# Patient Record
Sex: Female | Born: 1985 | Race: White | Hispanic: No | Marital: Married | Smoking: Never smoker
Health system: Southern US, Community
[De-identification: ages and names within clinical notes are randomized; demographics above are authoritative.]

## PROBLEM LIST (undated history)

## (undated) DIAGNOSIS — R519 Headache, unspecified: Secondary | ICD-10-CM

## (undated) DIAGNOSIS — R51 Headache: Secondary | ICD-10-CM

## (undated) HISTORY — PX: WISDOM TOOTH EXTRACTION: SHX21

## (undated) HISTORY — PX: FOOT SURGERY: SHX648

---

## 2017-07-24 ENCOUNTER — Other Ambulatory Visit (HOSPITAL_COMMUNITY): Payer: Self-pay | Admitting: Nurse Practitioner

## 2017-07-24 DIAGNOSIS — O289 Unspecified abnormal findings on antenatal screening of mother: Secondary | ICD-10-CM

## 2017-07-29 ENCOUNTER — Encounter (HOSPITAL_COMMUNITY): Payer: Self-pay | Admitting: *Deleted

## 2017-07-31 ENCOUNTER — Ambulatory Visit (HOSPITAL_COMMUNITY)
Admission: RE | Admit: 2017-07-31 | Discharge: 2017-07-31 | Disposition: A | Discharge status: Home / Self Care | Payer: PRIVATE HEALTH INSURANCE | Source: Ambulatory Visit | Attending: Nurse Practitioner | Admitting: Nurse Practitioner

## 2017-07-31 ENCOUNTER — Encounter (HOSPITAL_COMMUNITY): Payer: Self-pay

## 2017-07-31 ENCOUNTER — Ambulatory Visit (HOSPITAL_COMMUNITY): Admission: RE | Admit: 2017-07-31 | Payer: PRIVATE HEALTH INSURANCE | Source: Ambulatory Visit

## 2017-07-31 ENCOUNTER — Other Ambulatory Visit (HOSPITAL_COMMUNITY): Payer: Self-pay | Admitting: Nurse Practitioner

## 2017-07-31 DIAGNOSIS — O289 Unspecified abnormal findings on antenatal screening of mother: Secondary | ICD-10-CM | POA: Insufficient documentation

## 2017-07-31 DIAGNOSIS — O99212 Obesity complicating pregnancy, second trimester: Secondary | ICD-10-CM | POA: Insufficient documentation

## 2017-07-31 DIAGNOSIS — Z3A14 14 weeks gestation of pregnancy: Secondary | ICD-10-CM

## 2017-07-31 DIAGNOSIS — Z315 Encounter for genetic counseling: Secondary | ICD-10-CM | POA: Insufficient documentation

## 2017-07-31 DIAGNOSIS — Z363 Encounter for antenatal screening for malformations: Secondary | ICD-10-CM

## 2017-07-31 DIAGNOSIS — O283 Abnormal ultrasonic finding on antenatal screening of mother: Secondary | ICD-10-CM | POA: Insufficient documentation

## 2017-07-31 HISTORY — DX: Headache: R51

## 2017-07-31 HISTORY — DX: Headache, unspecified: R51.9

## 2017-07-31 NOTE — Progress Notes (Signed)
Genetic Counseling  High-Risk Gestation Note  Appointment Date:  07/31/2017 Referred By: Orbie Hurst, NP Date of Birth:  March 28, 1985 Partner:  Kenn File   Pregnancy History: [id number] Estimated Date of Delivery: 01/28/18 Estimated Gestational Age: [redacted]w[redacted]d Attending: Particia Nearing, MD  Tina Martinez and her husband, Mr. Kateryna Grantham, were seen for genetic counseling because of previous ultrasound finding of increased nuchal translucency.  The patient's mother was also present for today's visit.   In summary:  Reviewed increased nuchal translucency on ultrasound and associated possible etiologies  Discussed significance of prior screening for fetal aneuploidy  NIPS (Panorama) within normal limits  Offered additional screening  NIPS for single gene conditions ([facility])- declined today  Expanded carrier screening panel- declined today  Detailed ultrasound- scheduled 08/27/17  Fetal echocardiogram- being scheduled  Discussed option of diagnostic testing  Amniocentesis- declined  Reviewed ACOG carrier screening options  Reviewed family history concerns  The patient had a nuchal translucency (NT) ultrasound at the referring provider's office, which revealed an NT measurement of 3.7 mm at CRL of 71.6 mm. Ultrasound was performed today. Complete ultrasound results reported under separate cover.    We discussed that the fetal NT refers to a fluid filled space between the skin and soft tissues behind the cervical spine. This space is traditionally measurable between 11 and 13.[redacted] weeks gestation and is considered enlarged when the measurement is equal to or greater than the 95th percentile for the gestational age. This couple was counseled regarding the various common etiologies for an enlarged NT including: aneuploidy, single gene conditions, cardiac or great vessel abnormalities, lymphatic system failure, decreased fetal movement, and fetal anemia.   We reviewed chromosomes,  nondisjunction, and the common features and prognoses of Down syndrome and other aneuploidies.  Tina Martinez previously had noninvasive prenatal screening (NIPS)/prenatal cell free DNA testing facilitated through her OB provider. This screening, Panorama through [facility] laboratory was within normal limits, indicating a less than 1 in 10,000 risk for Trisomy 21, Trisomy 18, Trisomy 13, and monosomy X. Additional screening for 22q11.2 deletion syndrome was within normal limits. We reviewed the methodology and sensitivity of NIPS. They understand that this is highly sensitive and specific but is not diagnostic and does not assess for all chromosome conditions.   We reviewed other available screening option of detailed ultrasound and the benefits and limitations as a screen for fetal aneuploidy. Specifically, we discussed the conditions for which each test screens, the detection rates, and false positive rates of each. They were also counseled regarding diagnostic testing via amniocentesis for karyotype and microarray analysis. We reviewed the associated risks for complications, including spontaneous pregnancy loss. We discussed the possible results that the tests might provide including: positive, negative, unanticipated, and no result. Finally, they were counseled regarding the cost of each option and potential out of pocket expenses. Tina Martinez declined amniocentesis at this time.   In addition, we discussed that an NT of 3.7 mm is associated with an approximate 3% risk for a fetal cardiac anomaly. We discussed the association with less common structural defects and increased NT also. We discussed the options of a fetal echocardiogram and detailed anatomy ultrasound as methods to evaluate the fetal heart. A fetal echocardiogram was ordered today. Detailed ultrasound is scheduled for 08/27/17.   We also discussed single gene conditions. They were counseled that an increased NT is associated with an increased chance  for specific single gene conditions including Noonan spectrum disorders, skeletal dysplasias, SLOS, CdLS, and many others.  We discussed that these conditions are not routinely tested for prenatally unless ultrasound findings or family history significantly increase the suspicion of a specific single gene disorder; however, there is new noninvasive prenatal screening (NIPS) technology which assesses for specific alterations in 30 genes, including the genes associated with Noonan syndrome, some skeletal dysplasias, and CdLS. We discussed that this testing, marketed as Javier Docker, is available. We reviewed the benefits, limitations, and cost of this technology.  They understand that many of the features of these conditions can be detected by detailed ultrasound during the second trimester; however, ultrasound cannot definitively diagnose or rule out these conditions. We briefly reviewed common inheritance patterns (dominant, recessive, and X-linked) as well as the associated risks of recurrence. Regarding autosomal recessive and some X-linked conditions, we discussed the screening option of expanded carrier screening panels to assess for carrier status of a panel of single gene conditions, some of which but not all may have increased NT as an associated feature. We reviewed risks, benefits, and limitations of expanded carrier screening for the couple. After careful consideration, Tina Martinez declined single gene NIPS ([facility]ara) and expanded carrier screening at today's visit. She stated that she would possibly consider these further genetic screens pending results of detailed ultrasound.   We then discussed that an increased NT value can be a normal variant, which can resolve during the pregnancy. They were counseled that the fetal prognosis depends on the underlying etiology of the enlarged NT and further anticipatory guidance can be provided if a diagnosis is discovered.  Tina Martinez was provided with written  information regarding cystic fibrosis (CF), spinal muscular atrophy (SMA) and hemoglobinopathies including the carrier frequency, availability of carrier screening and prenatal diagnosis if indicated.  In addition, we discussed that CF and hemoglobinopathies are routinely screened for as part of the [place] newborn screening panel.  After further discussion, she declined screening for CF, SMA and hemoglobinopathies.  Both family histories were reviewed and found to be contributory for a female maternal second cousin once removed to the patient with Down syndrome (unspecified type). We discussed that 95% of cases of Down syndrome are not inherited and are the result of non-disjunction.  Three to 4% of cases of Down syndrome are the result of a translocation involving chromosome #21.  We discussed the option of chromosome analysis to determine if an individual is a carrier of a balanced translocation involving chromosome #21.  If an individual carries a balanced translocation involving chromosome #21, then the chance to have a baby with Down syndrome would be greater than the maternal age-related risk. The reported family history is most suggestive of sporadically occurring Down syndrome.      Additionally, the patient reported three female maternal second cousins once removed with autism, underlying etiology unknown. We discussed that autism is part of the spectrum of conditions referred to as Autistic spectrum disorders (ASD). We discussed that ASDs are among the most common neurodevelopmental disorders, with approximately 1 in 68 children meeting criteria for ASD, according to the Centers for Disease Control. Approximately 80% of individuals diagnosed are female. There is strong evidence that genetic factors play a critical role in development of ASD. There have been recent advances in identifying specific genetic causes of ASD, however, there are still many individuals for whom the etiology of the ASD is not known. The  majority of individuals with ASD (70-80%) have essential autism. There is strong evidence that genetic factors play a critical role in development of ASD. Some  individuals with ASDs are found to have causative differences in karyotype analysis, chromosomal microarray analysis, or single genes.  They understand that at this time there is not genetic testing available for ASD for most families. In the case of an identified genetic cause, recurrence risk estimate may change, and in some cases could be up to 50%. The patient is aware that for many individuals with autism spectrum disorders an underlying genetic cause is not identified at this time. In the absence of an identified genetic etiology, prenatal screening or testing would not be available in the current pregnancy for the autism spectrum disorders in the family. We discussed the option of Fragile X carrier screening with the patient including benefits and limitations. She declined Fragile X (FMR1) carrier screening at this time. Without further information regarding the provided family history, an accurate genetic risk cannot be calculated. Further genetic counseling is warranted if more information is obtained.  Mrs. Veneziano denied exposure to environmental toxins or chemical agents. She denied the use of alcohol, tobacco or street drugs. She denied significant viral illnesses during the course of her pregnancy.   I counseled this couple regarding the above risks and available options.  The approximate face-to-face time with the genetic counselor was 45 minutes.      Quinn Plowman, MS Certified Genetic Counselor 07/31/2017

## 2017-08-03 ENCOUNTER — Other Ambulatory Visit (HOSPITAL_COMMUNITY): Payer: Self-pay | Admitting: Nurse Practitioner

## 2017-08-03 ENCOUNTER — Other Ambulatory Visit (HOSPITAL_COMMUNITY): Payer: Self-pay | Admitting: *Deleted

## 2017-08-03 DIAGNOSIS — O289 Unspecified abnormal findings on antenatal screening of mother: Secondary | ICD-10-CM

## 2017-08-03 DIAGNOSIS — O283 Abnormal ultrasonic finding on antenatal screening of mother: Secondary | ICD-10-CM

## 2017-08-03 DIAGNOSIS — Z3A14 14 weeks gestation of pregnancy: Secondary | ICD-10-CM

## 2017-08-27 ENCOUNTER — Other Ambulatory Visit (HOSPITAL_COMMUNITY): Payer: Self-pay | Admitting: *Deleted

## 2017-08-27 ENCOUNTER — Ambulatory Visit (HOSPITAL_COMMUNITY)
Admission: RE | Admit: 2017-08-27 | Discharge: 2017-08-27 | Disposition: A | Discharge status: Home / Self Care | Payer: Self-pay | Source: Ambulatory Visit | Attending: Nurse Practitioner | Admitting: Nurse Practitioner

## 2017-08-27 ENCOUNTER — Encounter (HOSPITAL_COMMUNITY): Payer: Self-pay

## 2017-08-27 ENCOUNTER — Other Ambulatory Visit (HOSPITAL_COMMUNITY): Payer: Self-pay | Admitting: Maternal and Fetal Medicine

## 2017-08-27 DIAGNOSIS — E669 Obesity, unspecified: Secondary | ICD-10-CM | POA: Insufficient documentation

## 2017-08-27 DIAGNOSIS — O99212 Obesity complicating pregnancy, second trimester: Secondary | ICD-10-CM | POA: Insufficient documentation

## 2017-08-27 DIAGNOSIS — O359XX0 Maternal care for (suspected) fetal abnormality and damage, unspecified, not applicable or unspecified: Secondary | ICD-10-CM

## 2017-08-27 DIAGNOSIS — O289 Unspecified abnormal findings on antenatal screening of mother: Secondary | ICD-10-CM

## 2017-08-27 DIAGNOSIS — O283 Abnormal ultrasonic finding on antenatal screening of mother: Secondary | ICD-10-CM | POA: Insufficient documentation

## 2017-08-27 DIAGNOSIS — Z3A18 18 weeks gestation of pregnancy: Secondary | ICD-10-CM

## 2017-08-27 DIAGNOSIS — Z363 Encounter for antenatal screening for malformations: Secondary | ICD-10-CM

## 2017-08-27 DIAGNOSIS — Z362 Encounter for other antenatal screening follow-up: Secondary | ICD-10-CM

## 2017-09-10 ENCOUNTER — Other Ambulatory Visit: Payer: Self-pay

## 2017-09-10 ENCOUNTER — Encounter (HOSPITAL_COMMUNITY): Payer: Self-pay

## 2017-09-25 ENCOUNTER — Other Ambulatory Visit (HOSPITAL_COMMUNITY): Payer: Self-pay | Admitting: Maternal and Fetal Medicine

## 2017-09-25 ENCOUNTER — Encounter (HOSPITAL_COMMUNITY): Payer: Self-pay

## 2017-09-25 ENCOUNTER — Ambulatory Visit (HOSPITAL_COMMUNITY)
Admission: RE | Admit: 2017-09-25 | Discharge: 2017-09-25 | Disposition: A | Discharge status: Home / Self Care | Payer: PRIVATE HEALTH INSURANCE | Source: Ambulatory Visit | Attending: Nurse Practitioner | Admitting: Nurse Practitioner

## 2017-09-25 DIAGNOSIS — Z362 Encounter for other antenatal screening follow-up: Secondary | ICD-10-CM | POA: Insufficient documentation

## 2017-09-25 DIAGNOSIS — O359XX0 Maternal care for (suspected) fetal abnormality and damage, unspecified, not applicable or unspecified: Secondary | ICD-10-CM

## 2017-09-25 DIAGNOSIS — Z3A22 22 weeks gestation of pregnancy: Secondary | ICD-10-CM

## 2017-09-25 DIAGNOSIS — O289 Unspecified abnormal findings on antenatal screening of mother: Secondary | ICD-10-CM | POA: Insufficient documentation

## 2017-09-25 DIAGNOSIS — O99212 Obesity complicating pregnancy, second trimester: Secondary | ICD-10-CM | POA: Insufficient documentation

## 2017-09-25 DIAGNOSIS — O283 Abnormal ultrasonic finding on antenatal screening of mother: Secondary | ICD-10-CM

## 2018-04-22 ENCOUNTER — Encounter (HOSPITAL_COMMUNITY): Payer: Self-pay

## 2019-01-08 IMAGING — US US MFM OB FOLLOW-UP
1 series · 14 of 28 positions shown · non-contrast
Comparison: none

[Series 1: us mfm ob follow-up · 57 acquisitions, 14 frames shown]
[im 3/57]
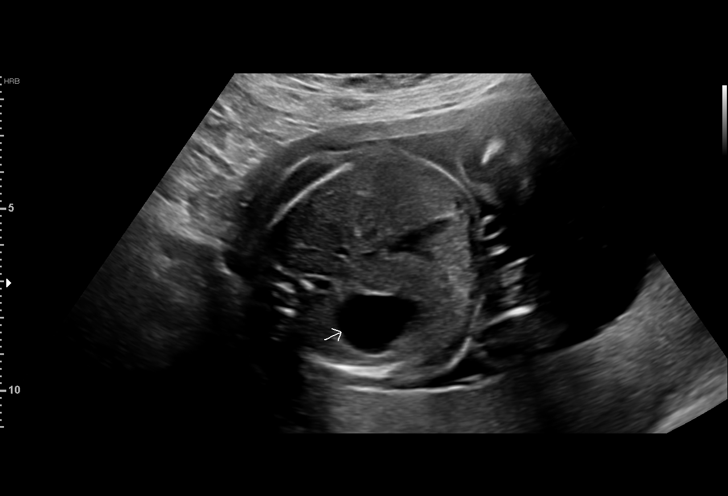
[im 7/57]
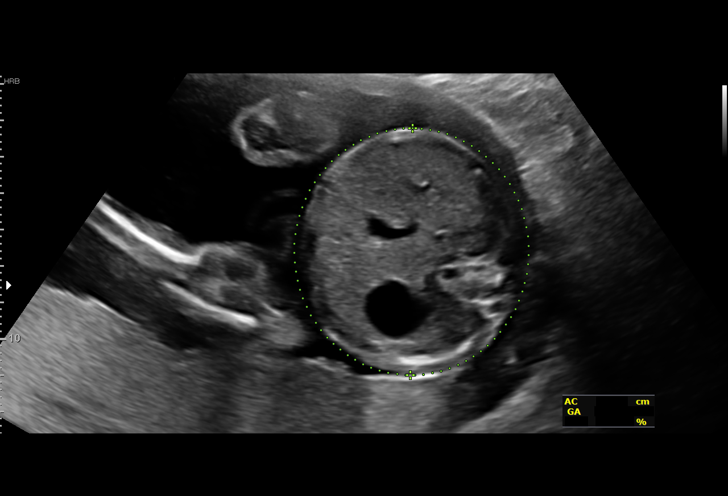
[im 11/57]
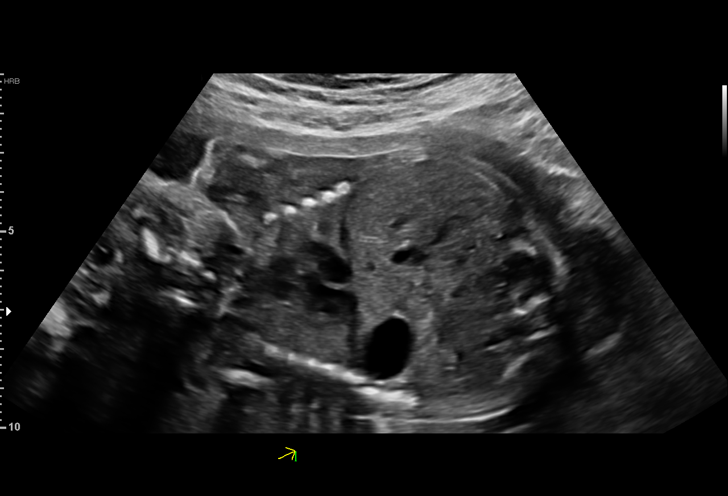
[im 15/57]
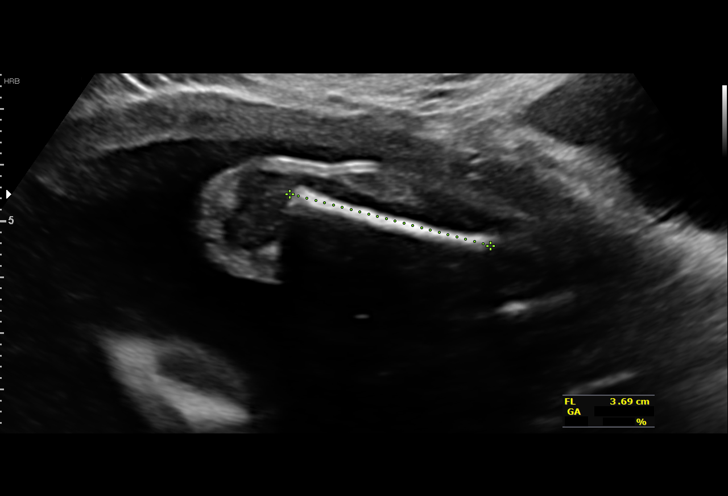
[im 19/57]
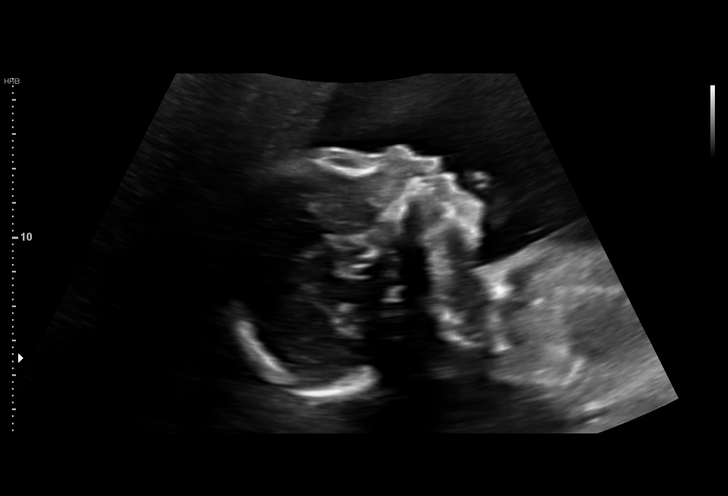
[im 23/57]
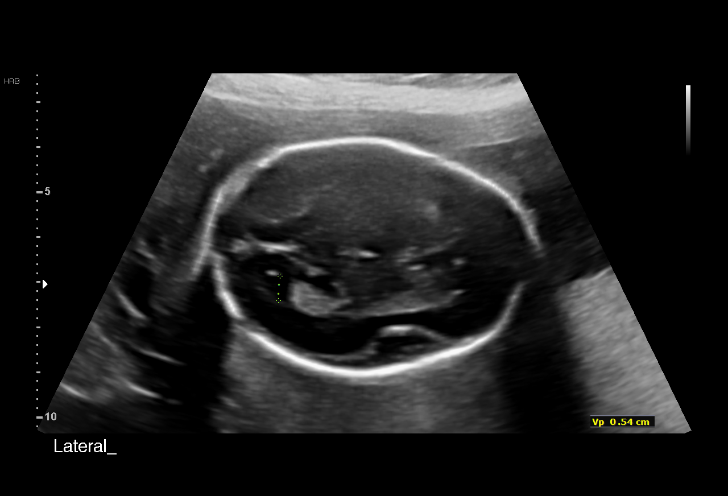
[im 27/57]
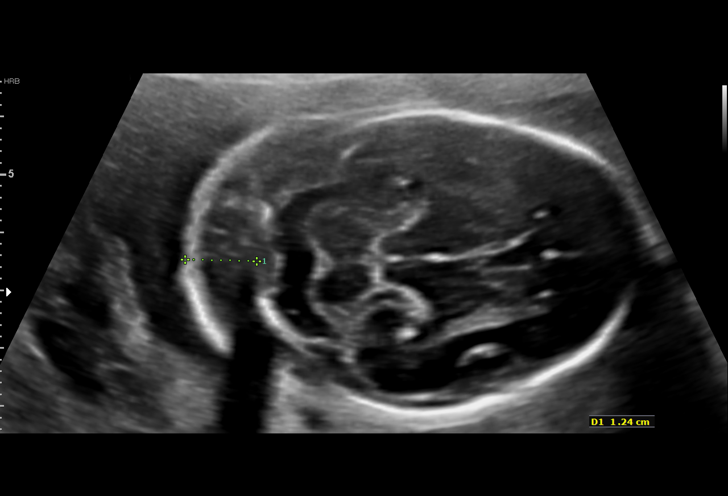
[im 32/57]
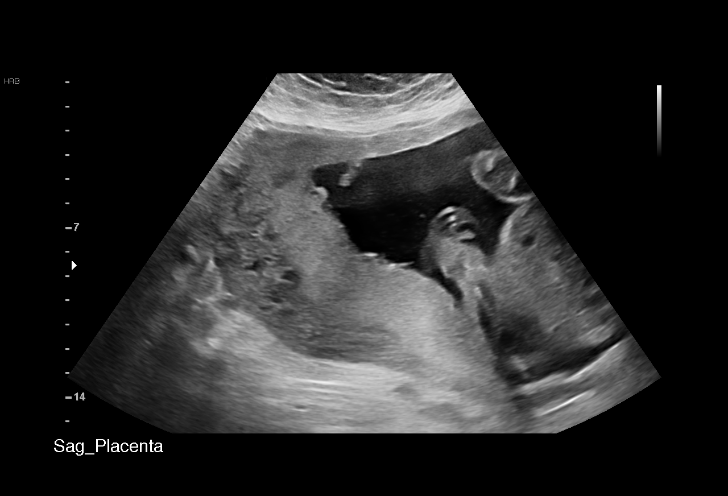
[im 36/57]
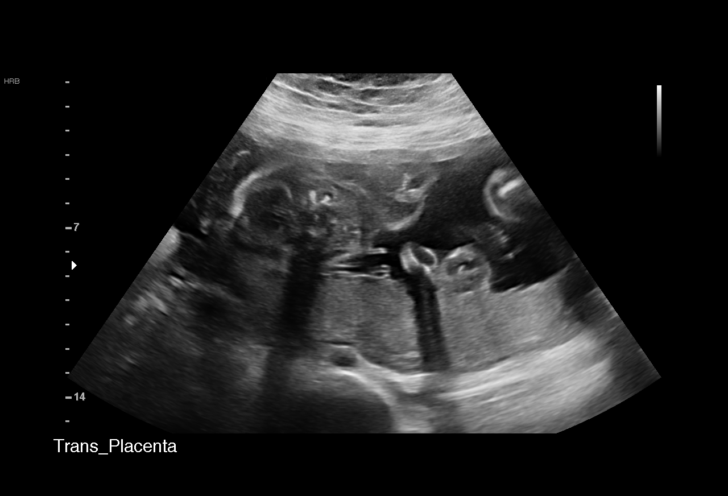
[im 40/57]
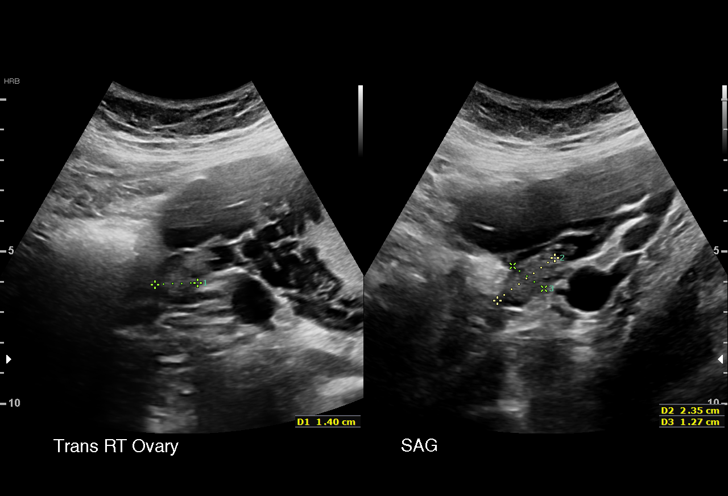
[im 44/57]
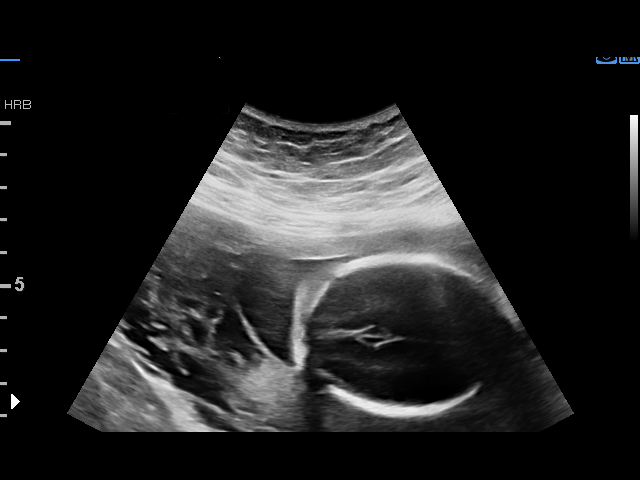
[im 48/57]
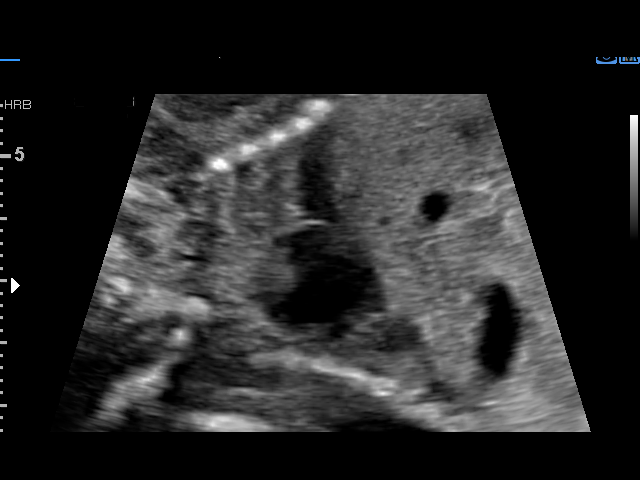
[im 52/57]
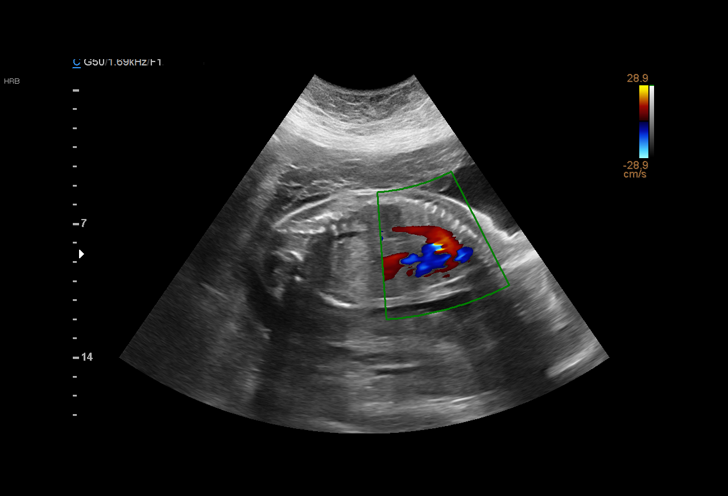
[im 57/57]
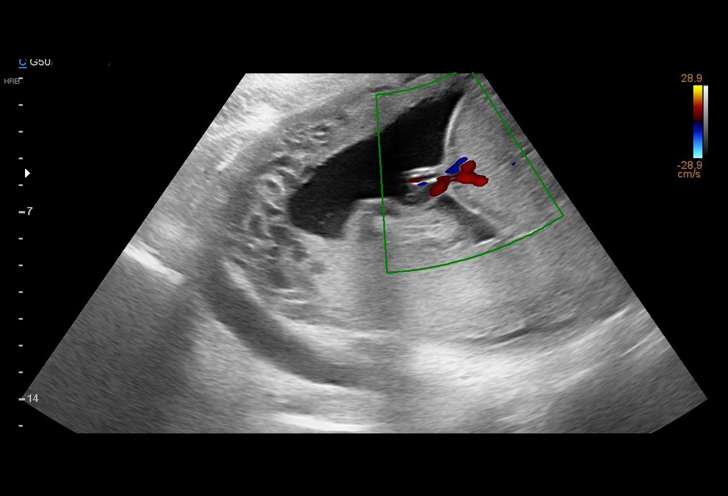

[14 of 28 positions shown; findings below may reference images not displayed]

Centre
[address]
Attending:        Aleksandr Ospina          Location:         [HOSPITAL]

1  [facility] D [facility]OR            [id number]      [id number]     [id number]
Indications

22 weeks gestation of pregnancy
Encounter for antenatal screening for
malformations
Abnormal fetal ultrasound (thickened NT
3.7mm); low risk NIPS
Fetal abnormality - thickened NT; low risk
NIPS, declined other screening
Obesity complicating pregnancy, second
trimester (pregravid BMI 35)
OB History

Gravidity:    5         Term:   2         SAB:   1
TOP:          1        Living:  2
Fetal Evaluation

Num Of Fetuses:     1
Fetal Heart         126
Rate(bpm):
Cardiac Activity:   Observed
Presentation:       Transverse, head to maternal right
Placenta:           Posterior, above cervical os
P. Cord Insertion:  Previously Visualized

Amniotic Fluid
AFI FV:      Subjectively within normal limits

Largest Pocket(cm)
4.92
Biometry

BPD:      51.6  mm     G. Age:  21w 5d         28  %    CI:        69.72   %    70 - 86
FL/HC:      18.5   %    18.4 -
HC:      197.2  mm     G. Age:  21w 6d         29  %    HC/AC:      0.96        1.06 -
AC:       206   mm     G. Age:  25w 1d       > 97  %    FL/BPD:     70.7   %    71 - 87
FL:       36.5  mm     G. Age:  21w 4d         23  %    FL/AC:      17.7   %    20 - 24
HUM:      36.4  mm     G. Age:  22w 5d         61  %

Est. FW:     587  gm      1 lb 5 oz     66  %
Gestational Age

LMP:           22w 1d        Date:  04/23/17                 EDD:   01/28/18
U/S Today:     22w 4d                                        EDD:   01/25/18
Best:          22w 1d     Det. By:  LMP  (04/23/17)          EDD:   01/28/18
Anatomy

Cranium:               Appears normal         Aortic Arch:            Appears normal
Cavum:                 Appears normal         Ductal Arch:            Not well visualized
Ventricles:            Previously seen        Diaphragm:              Appears normal
Choroid Plexus:        Previously seen        Stomach:                Appears normal, left
sided
Cerebellum:            Previously seen        Abdomen:                Appears normal
Posterior Fossa:       Previously seen        Abdominal Wall:         Appears nml (cord
insert, abd wall)
Nuchal Fold:           Appears enlarged,      Cord Vessels:           Appears normal (3
12.4 mm
vessel cord)
Face:                  Appears normal         Kidneys:                Appear normal
(orbits and profile)
Lips:                  Appears normal         Bladder:                Appears normal
Thoracic:              Appears normal         Spine:                  Appears normal
Heart:                 Appears normal         Upper Extremities:      Appears normal
(4CH, axis, and situs
RVOT:                  Appears normal;        Lower Extremities:      Appears normal
color
LVOT:                  Appears normal

Other:  Male gender. Heels and 5th digit visualized. Nasal bone visualized.
Open hands visualized. Technically difficult due to maternal habitus
and fetal position.
Cervix Uterus Adnexa

Cervix
Length:           4.12  cm.
Normal appearance by transabdominal scan.
Impression

Thickened nuchal fold with low risk on aneuploidy screen.
Ductal arch suboptimally viewed but reported as normal
following fetal echocardiogram at [REDACTED] .
Recommendations

Follow-up scans as clinically indicated and or scheduled.
